# Patient Record
Sex: Male | Born: 1970 | Hispanic: No | Marital: Married | Smoking: Never smoker
Health system: Southern US, Community
[De-identification: ages and names within clinical notes are randomized; demographics above are authoritative.]

## PROBLEM LIST (undated history)

## (undated) DIAGNOSIS — N200 Calculus of kidney: Secondary | ICD-10-CM

## (undated) DIAGNOSIS — N189 Chronic kidney disease, unspecified: Secondary | ICD-10-CM

## (undated) HISTORY — PX: NO PAST SURGERIES: SHX2092

## (undated) HISTORY — DX: Calculus of kidney: N20.0

---

## 2013-01-24 ENCOUNTER — Emergency Department (HOSPITAL_COMMUNITY)
Admission: EM | Admit: 2013-01-24 | Discharge: 2013-01-24 | Disposition: A | Discharge status: Home / Self Care | Payer: 59 | Attending: Emergency Medicine | Admitting: Emergency Medicine

## 2013-01-24 ENCOUNTER — Encounter (HOSPITAL_COMMUNITY): Payer: Self-pay | Admitting: *Deleted

## 2013-01-24 ENCOUNTER — Emergency Department (HOSPITAL_COMMUNITY): Payer: 59

## 2013-01-24 DIAGNOSIS — N23 Unspecified renal colic: Secondary | ICD-10-CM | POA: Insufficient documentation

## 2013-01-24 DIAGNOSIS — R112 Nausea with vomiting, unspecified: Secondary | ICD-10-CM | POA: Insufficient documentation

## 2013-01-24 DIAGNOSIS — R61 Generalized hyperhidrosis: Secondary | ICD-10-CM | POA: Insufficient documentation

## 2013-01-24 LAB — COMPREHENSIVE METABOLIC PANEL
ALT: 25 U/L (ref 0–53)
AST: 35 U/L (ref 0–37)
Albumin: 4.3 g/dL (ref 3.5–5.2)
Alkaline Phosphatase: 79 U/L (ref 39–117)
BUN: 15 mg/dL (ref 6–23)
CO2: 25 mEq/L (ref 19–32)
Calcium: 9.2 mg/dL (ref 8.4–10.5)
Chloride: 102 mEq/L (ref 96–112)
Creatinine, Ser: 0.85 mg/dL (ref 0.50–1.35)
GFR calc Af Amer: >90 mL/min (ref >90)
GFR calc non Af Amer: >90 mL/min (ref >90)
Glucose, Bld: 134 mg/dL — ABNORMAL HIGH (ref 70–99)
Potassium: 4.4 mEq/L (ref 3.5–5.1)
Sodium: 139 mEq/L (ref 135–145)
Total Bilirubin: 0.3 mg/dL (ref 0.3–1.2)
Total Protein: 7.8 g/dL (ref 6.0–8.3)

## 2013-01-24 LAB — URINALYSIS, MICROSCOPIC ONLY
Bilirubin Urine: NEGATIVE
Glucose, UA: NEGATIVE mg/dL
Ketones, ur: NEGATIVE mg/dL
Leukocytes, UA: NEGATIVE
Nitrite: NEGATIVE
Protein, ur: NEGATIVE mg/dL
Specific Gravity, Urine: 1.022 (ref 1.005–1.030)
Urobilinogen, UA: 0.2 mg/dL (ref 0.0–1.0)
pH: 7.5 (ref 5.0–8.0)

## 2013-01-24 LAB — CBC WITH DIFFERENTIAL/PLATELET
Basophils Absolute: 0 10*3/uL (ref 0.0–0.1)
Basophils Relative: 0 % (ref 0–1)
Eosinophils Absolute: 0.1 10*3/uL (ref 0.0–0.7)
Eosinophils Relative: 1 % (ref 0–5)
HCT: 42.9 % (ref 39.0–52.0)
Hemoglobin: 14.9 g/dL (ref 13.0–17.0)
Lymphocytes Relative: 15 % (ref 12–46)
Lymphs Abs: 1.5 10*3/uL (ref 0.7–4.0)
MCH: 28.4 pg (ref 26.0–34.0)
MCHC: 34.7 g/dL (ref 30.0–36.0)
MCV: 81.7 fL (ref 78.0–100.0)
Monocytes Absolute: 0.3 10*3/uL (ref 0.1–1.0)
Monocytes Relative: 3 % (ref 3–12)
Neutro Abs: 8.2 10*3/uL — ABNORMAL HIGH (ref 1.7–7.7)
Neutrophils Relative %: 81 % — ABNORMAL HIGH (ref 43–77)
Platelets: 145 10*3/uL — ABNORMAL LOW (ref 150–400)
RBC: 5.25 MIL/uL (ref 4.22–5.81)
RDW: 12.7 % (ref 11.5–15.5)
WBC: 10.1 10*3/uL (ref 4.0–10.5)

## 2013-01-24 LAB — POCT I-STAT TROPONIN I: Troponin i, poc: 0 ng/mL (ref 0.00–0.08)

## 2013-01-24 MED ORDER — IOHEXOL 300 MG/ML  SOLN
100.0000 mL | Freq: Once | INTRAMUSCULAR | Status: AC | PRN
  Administered 2013-01-24: 100 mL via INTRAVENOUS

## 2013-01-24 MED ORDER — ONDANSETRON HCL 4 MG/2ML IJ SOLN
4.0000 mg | Freq: Once | INTRAMUSCULAR | Status: AC
  Administered 2013-01-24: 4 mg via INTRAVENOUS
  Filled 2013-01-24: qty 2

## 2013-01-24 MED ORDER — OXYCODONE-ACETAMINOPHEN 5-325 MG PO TABS: 1.0000 | ORAL_TABLET | ORAL | Status: DC | PRN

## 2013-01-24 MED ORDER — MORPHINE SULFATE 4 MG/ML IJ SOLN
4.0000 mg | Freq: Once | INTRAMUSCULAR | Status: AC
  Administered 2013-01-24: 4 mg via INTRAVENOUS
  Filled 2013-01-24: qty 1

## 2013-01-24 MED ORDER — FENTANYL CITRATE 0.05 MG/ML IJ SOLN
50.0000 ug | Freq: Once | INTRAMUSCULAR | Status: AC
  Administered 2013-01-24: 06:00:00 via INTRAVENOUS
  Filled 2013-01-24: qty 2

## 2013-01-24 MED ORDER — IOHEXOL 300 MG/ML  SOLN
50.0000 mL | Freq: Once | INTRAMUSCULAR | Status: AC | PRN
  Administered 2013-01-24: 50 mL via ORAL

## 2013-01-24 MED ORDER — PROMETHAZINE HCL 25 MG PO TABS: 25.0000 mg | ORAL_TABLET | Freq: Four times a day (QID) | ORAL | Status: DC | PRN

## 2013-01-24 NOTE — ED Provider Notes (Signed)
History     CSN: [id number]  Arrival date & time 01/24/13  0531   First MD Initiated Contact with Patient 01/24/13 [phone]      Chief Complaint  Patient presents with  . Abdominal Pain  . Emesis    (Consider location/radiation/quality/duration/timing/severity/associated sxs/prior treatment) HPI Comments: Patient presents to the emergency department with complaints of abdominal pain. Patient reports that the pain began around 2:30 AM. He reports severe, sharp, stabbing pains in the right lower abdomen. Patient reports pain was accompanied by nausea, vomiting and sweating. He has not had a fever. Patient reports that he had similar pain approximately 10 years ago and was diagnosed with a kidney stone.  Patient is a 42 y.o. male presenting with abdominal pain and vomiting.  Abdominal Pain Associated symptoms: nausea and vomiting   Associated symptoms: no constipation, no diarrhea and no fever   Emesis Associated symptoms: abdominal pain   Associated symptoms: no diarrhea     No past medical history on file.  No past surgical history on file.  History reviewed. No pertinent family history.  History  Substance Use Topics  . Smoking status: Never Smoker   . Smokeless tobacco: Not on file  . Alcohol Use: No      Review of Systems  Constitutional: Positive for diaphoresis. Negative for fever.  Gastrointestinal: Positive for nausea, vomiting and abdominal pain. Negative for diarrhea, constipation and blood in stool.  All other systems reviewed and are negative.    Allergies  Review of patient's allergies indicates no known allergies.  Home Medications  No current outpatient prescriptions on file.  BP 123/93  Pulse 56  Temp(Src) 97.8 F (36.6 C) (Oral)  Resp 16  SpO2 100%  Physical Exam  Constitutional: He is oriented to person, place, and time. He appears well-developed and well-nourished. No distress.  HENT:  Head: Normocephalic and atraumatic.  Right Ear:  Hearing normal.  Nose: Nose normal.  Mouth/Throat: Oropharynx is clear and moist and mucous membranes are normal.  Eyes: Conjunctivae and EOM are normal. Pupils are equal, round, and reactive to light.  Neck: Normal range of motion. Neck supple.  Cardiovascular: Normal rate, regular rhythm, S1 normal and S2 normal.  Exam reveals no gallop and no friction rub.   No murmur heard. Pulmonary/Chest: Effort normal and breath sounds normal. No respiratory distress. He exhibits no tenderness.  Abdominal: Soft. Normal appearance and bowel sounds are normal. There is no hepatosplenomegaly. There is tenderness in the right lower quadrant. There is no rebound, no guarding, no tenderness at McBurney's point and negative Murphy's sign. No hernia.  Musculoskeletal: Normal range of motion.  Neurological: He is alert and oriented to person, place, and time. He has normal strength. No cranial nerve deficit or sensory deficit. Coordination normal. GCS eye subscore is 4. GCS verbal subscore is 5. GCS motor subscore is 6.  Skin: Skin is warm, dry and intact. No rash noted. No cyanosis.  Psychiatric: He has a normal mood and affect. His speech is normal and behavior is normal. Thought content normal.    ED Course  Procedures (including critical care time)  Labs Reviewed  CBC WITH DIFFERENTIAL - Abnormal; Notable for the following:    Platelets 145 (*)    Neutrophils Relative 81 (*)    Neutro Abs 8.2 (*)    All other components within normal limits  COMPREHENSIVE METABOLIC PANEL - Abnormal; Notable for the following:    Glucose, Bld 134 (*)    All other components within  normal limits  URINALYSIS, MICROSCOPIC ONLY - Abnormal; Notable for the following:    Hgb urine dipstick LARGE (*)    Bacteria, UA FEW (*)    All other components within normal limits  POCT I-STAT TROPONIN I   Ct Abdomen Pelvis W Contrast  01/24/2013  *RADIOLOGY REPORT*  Clinical Data: Right lower quadrant pain and emesis  CT ABDOMEN AND  PELVIS WITH CONTRAST  Technique:  Multidetector CT imaging of the abdomen and pelvis was performed following the standard protocol during bolus administration of intravenous contrast.  Contrast: 50mL OMNIPAQUE IOHEXOL 300 MG/ML  SOLN, OMNIPAQUE IOHEXOL 300 MG/ML  SOLN  Comparison: None.  Findings: The lung bases appear clear.  No pericardial or pleural effusion.  There are no suspicious liver abnormalities identified. Gallbladder is normal.  There is no biliary dilatation.  The pancreas is unremarkable.  Normal appearance of the spleen.  The adrenal glands are both unremarkable.  Asymmetric right-sided hydronephrosis and hydroureter is noted.  Within the mid right ureter there is a stone measuring 5 mm, image 44/series 2.  The left kidney appears normal.  The urinary bladder appears normal. Prostate gland and seminal vesicles are unremarkable.  Normal caliber of the abdominal aorta.  There is no enlarged upper abdominal or pelvic lymph nodes.  The stomach is normal.  The small bowel loops have a normal course and caliber.  The appendix is visualized and appears within normal limits.  Normal appearance of the proximal colon.  Distal colon is unremarkable.  Review of the visualized osseous structures is unremarkable.  IMPRESSION:  1.  Stone within the right mid ureter is identified measuring 5 mm   Original Report Authenticated By: Signa Kell, M.D.      Diagnoses: Right renal colic    MDM  Patient comes to the ER for evaluation of right-sided abdominal and flank pain. Symptoms began early this morning. He has had nausea and vomiting associated with the symptoms. Renal colic secondary to ureterolithiasis with suspected, the patient did have some tenderness in the right lower quadrant and therefore CAT scan was ordered to rule out appendicitis. CAT scan did confirm 5 mm stone in the right mid ureter explaining the patient's symptoms. Patient is feeling better after IV analgesia here in the ER. He'll be  discharged with analgesia, followup with urology. Return to the ER symptoms are uncontrolled.        Gilda Crease, MD 01/24/13 1002

## 2013-02-03 ENCOUNTER — Emergency Department (HOSPITAL_COMMUNITY)
Admission: EM | Admit: 2013-02-03 | Discharge: 2013-02-03 | Disposition: A | Discharge status: Home Health | Payer: 59 | Attending: Emergency Medicine | Admitting: Emergency Medicine

## 2013-02-03 ENCOUNTER — Emergency Department (HOSPITAL_COMMUNITY): Payer: 59

## 2013-02-03 ENCOUNTER — Encounter (HOSPITAL_COMMUNITY): Payer: Self-pay | Admitting: *Deleted

## 2013-02-03 DIAGNOSIS — R109 Unspecified abdominal pain: Secondary | ICD-10-CM

## 2013-02-03 DIAGNOSIS — Z87442 Personal history of urinary calculi: Secondary | ICD-10-CM | POA: Insufficient documentation

## 2013-02-03 LAB — URINALYSIS, ROUTINE W REFLEX MICROSCOPIC
Bilirubin Urine: NEGATIVE
Glucose, UA: NEGATIVE mg/dL
Ketones, ur: NEGATIVE mg/dL
Leukocytes, UA: NEGATIVE
Nitrite: NEGATIVE
Protein, ur: NEGATIVE mg/dL
Specific Gravity, Urine: 1.017 (ref 1.005–1.030)
Urobilinogen, UA: 0.2 mg/dL (ref 0.0–1.0)
pH: 8.5 — ABNORMAL HIGH (ref 5.0–8.0)

## 2013-02-03 LAB — URINE MICROSCOPIC-ADD ON

## 2013-02-03 MED ORDER — HYDROMORPHONE HCL PF 1 MG/ML IJ SOLN
1.0000 mg | Freq: Once | INTRAMUSCULAR | Status: AC
  Administered 2013-02-03: 1 mg via INTRAVENOUS
  Filled 2013-02-03: qty 1

## 2013-02-03 MED ORDER — ONDANSETRON HCL 4 MG/2ML IJ SOLN
4.0000 mg | Freq: Once | INTRAMUSCULAR | Status: AC
  Administered 2013-02-03: 4 mg via INTRAVENOUS
  Filled 2013-02-03: qty 2

## 2013-02-03 MED ORDER — TAMSULOSIN HCL 0.4 MG PO CAPS: 0.4000 mg | ORAL_CAPSULE | Freq: Every day | ORAL | Status: DC

## 2013-02-03 MED ORDER — KETOROLAC TROMETHAMINE 30 MG/ML IJ SOLN
30.0000 mg | Freq: Once | INTRAMUSCULAR | Status: AC
  Administered 2013-02-03: 30 mg via INTRAVENOUS
  Filled 2013-02-03: qty 1

## 2013-02-03 MED ORDER — OXYCODONE-ACETAMINOPHEN 5-325 MG PO TABS: 2.0000 | ORAL_TABLET | ORAL | Status: DC | PRN

## 2013-02-03 NOTE — ED Notes (Signed)
MD at bedside. 

## 2013-02-03 NOTE — ED Notes (Signed)
Pt reports right sided flank pain, was seen in ED last week, dx with kidney stone, has not passed yet. Reports right sided flank pain progressively gotten worse, rates pain 10/10. Took one oxycodone today without relief of symptoms

## 2013-02-03 NOTE — ED Provider Notes (Signed)
History     CSN: [id number]  Arrival date & time 02/03/13  1518   First MD Initiated Contact with Patient 02/03/13 1601      Chief Complaint  Patient presents with  . Flank Pain    (Consider location/radiation/quality/duration/timing/severity/associated sxs/prior treatment) HPI.... right flank pain with radiation to right lower quadrant earlier today. Patient was seen on 01/24/2013 for a right-sided kidney stone. This was confirmed by CT scan. Stone 5 mm in diameter.  Patient has good appetite. Pain is moderate in intensity and sharp.  No fever, chills, dysuria, hematuria  History reviewed. No pertinent past medical history.  History reviewed. No pertinent past surgical history.  No family history on file.  History  Substance Use Topics  . Smoking status: Never Smoker   . Smokeless tobacco: Not on file  . Alcohol Use: No      Review of Systems  All other systems reviewed and are negative.    Allergies  Review of patient's allergies indicates no known allergies.  Home Medications   Current Outpatient Rx  Name  Route  Sig  Dispense  Refill  . oxyCODONE-acetaminophen (PERCOCET) 5-325 MG per tablet   Oral   Take 1-2 tablets by mouth every 4 (four) hours as needed for pain.   20 tablet   0   . oxyCODONE-acetaminophen (PERCOCET) 5-325 MG per tablet   Oral   Take 2 tablets by mouth every 4 (four) hours as needed for pain.   25 tablet   0   . tamsulosin (FLOMAX) 0.4 MG CAPS   Oral   Take 1 capsule (0.4 mg total) by mouth daily.   15 capsule   0     BP 118/68  Pulse 62  Temp(Src) 97.7 F (36.5 C) (Oral)  Resp 17  Ht 5\' 8"  (1.727 m)  Wt 150 lb (68.04 kg)  BMI 22.81 kg/m2  SpO2 98%  Physical Exam  Nursing note and vitals reviewed. Constitutional: He is oriented to person, place, and time. He appears well-developed and well-nourished.  HENT:  Head: Normocephalic and atraumatic.  Eyes: Conjunctivae and EOM are normal. Pupils are equal, round, and  reactive to light.  Neck: Normal range of motion. Neck supple.  Cardiovascular: Normal rate, regular rhythm and normal heart sounds.   Pulmonary/Chest: Effort normal and breath sounds normal.  Abdominal: Soft. Bowel sounds are normal.  Genitourinary:  Minimal tenderness right flank with minimal tenderness and right lower quadrant  Musculoskeletal: Normal range of motion.  Neurological: He is alert and oriented to person, place, and time.  Skin: Skin is warm and dry.  Psychiatric: He has a normal mood and affect.    ED Course  Procedures (including critical care time)  Labs Reviewed  URINALYSIS, ROUTINE W REFLEX MICROSCOPIC - Abnormal; Notable for the following:    APPearance TURBID (*)    pH 8.5 (*)    Hgb urine dipstick MODERATE (*)    All other components within normal limits  URINE MICROSCOPIC-ADD ON - Abnormal; Notable for the following:    Casts GRANULAR CAST (*)    All other components within normal limits   Dg Abd 1 View  02/03/2013  *RADIOLOGY REPORT*  Clinical Data: 42 year old male severe right sided lower abdominal pain.  History of right ureteral stone.  ABDOMEN - 1 VIEW  Comparison: CT abdomen and pelvis 01/24/2013.  Findings: Mid right ureteral calculus on the comparison was not visible on the scout view.  There is no other right nephrolithiasis at that  time.  Nonetheless, there is a subtle 5 mm calculus projecting partially over the L4 right transverse process.  Stable left hemi pelvis phlebolith.  No other abnormal calcification identified in the abdomen or pelvis.  Nonobstructed bowel gas pattern. No acute osseous abnormality identified.  IMPRESSION: 1.  Cannot exclude persistent right ureteral calculus at the L4 transverse process level. 2.  No other findings suspicious for urologic calculus. Nonobstructed bowel gas pattern.   Original Report Authenticated By: Erskine Speed, M.D.      1. Right flank pain       MDM  I suspect this is persistent kidney stone pain.  Patient feels better after pain management. We discussed the possibility of appendicitis, but patient still has a good appetite which typically goes against appendicitis. Discussed treatment options with patient and his wife. They will call the urologist on call if symptoms persist. He understands to return here for any worsening concerns. Rx Percocet #25 and Flomax #15        Donnetta Hutching, MD 02/03/13 2213

## 2013-03-16 ENCOUNTER — Other Ambulatory Visit: Payer: Self-pay | Admitting: Urology

## 2013-03-27 ENCOUNTER — Encounter (HOSPITAL_COMMUNITY): Payer: Self-pay | Admitting: Pharmacy Technician

## 2013-03-29 ENCOUNTER — Encounter (HOSPITAL_COMMUNITY): Payer: Self-pay | Admitting: *Deleted

## 2013-04-02 NOTE — H&P (Signed)
History of Present Illness  Calculus disease: A CT scan  On 01/24/13 revealed a 5 mm stone located at the level of the L3 vertebral body with Hounsfield units of ~500. No renal stones were identified. His urinalysis had a pH of 7.5 and 8.5 on 2 separate occasions. His electrolytes revealed a normal serum potassium, calcium and CO2. As he passed a stone about 10 years ago.  Interval history: He elected to proceed with medical expulsive therapy. He told me that in fact he has not been taking it regularly. He said he does not like to take medication. He has not been having any flank pain, hematuria or new voiding symptoms..   Past Medical History Problems  1. History of  No Medical Problems  Surgical History Problems  1. History of  No Surgical Problems  Current Meds 1. Oxycodone-Acetaminophen 5-325 MG Oral Tablet; Therapy: 22Mar2014 to 2. Tamsulosin HCl 0.4 MG Oral Capsule; Therapy: 22Mar2014 to  Allergies Medication  1. No Known Drug Allergies  Family History Problems  1. Family history of  No Significant Family History  Social History Problems  1. Caffeine Use 2. Marital History - Currently Married 3. Never A Smoker Denied  4. History of  Alcohol Use  Review of Systems Genitourinary, constitutional, skin, eye, otolaryngeal, hematologic/lymphatic, cardiovascular, pulmonary, endocrine, musculoskeletal, gastrointestinal, neurological and psychiatric system(s) were reviewed and pertinent findings if present are noted.  Gastrointestinal: nausea and vomiting.  Musculoskeletal: back pain.    Vitals Vital Signs BMI Calculated: 24.2 BSA Calculated: 1.83 Height: 5 ft 7.5 in Weight: 156 lb  Blood Pressure: 99 / 69 Temperature: 95.9 F Heart Rate: 57  Physical Exam Constitutional: Well nourished and well developed . No acute distress.  ENT:. The ears and nose are normal in appearance.  Neck: The appearance of the neck is normal and no neck mass is present.  Pulmonary: No  respiratory distress and normal respiratory rhythm and effort.  Cardiovascular: Heart rate and rhythm are normal . No peripheral edema.  Abdomen: The abdomen is soft and nontender. No masses are palpated. No CVA tenderness. No hernias are palpable. No hepatosplenomegaly noted.  Lymphatics: The femoral and inguinal nodes are not enlarged or tender.  Skin: Normal skin turgor, no visible rash and no visible skin lesions.  Neuro/Psych:. Mood and affect are appropriate.    Assessment  We discussed the fact that there has been no progression of the stone despite medical expulsive therapy. He has not been completely compliant with the medication. We discussed continued medical expulsive therapy but also I went over the treatment options with him today. The 2 options most suited for the stone based on its size and location would be ureteroscopy and lithotripsy. I discussed each of these in detail as well as the pluses and minuses of each of these forms of surgery. He elected to proceed with lithotripsy if he does not pass his stone over the next couple of weeks. He said he would like to reinitiate medical expulsive therapy. I told him that if he did not pass his stone we would proceed with lithotripsy and I therefore discussed it in complete detail with a discussion of the potential risks and complications, the probability of success, the outpatient nature and the anticipated postoperative course. Have answered all of his questions as well as his wife's. He understands and has elected to proceed if he does not pass the stone spontaneously.   Plan    1. I have refilled his prescription for tamsulosin today.  2. He'll be scheduled for lithotripsy contact me if he passes his stone.

## 2013-04-03 ENCOUNTER — Encounter (HOSPITAL_COMMUNITY)
Admission: RE | Disposition: A | Discharge status: Home / Self Care | Payer: Self-pay | Source: Ambulatory Visit | Attending: Urology

## 2013-04-03 ENCOUNTER — Ambulatory Visit (HOSPITAL_COMMUNITY): Payer: 59

## 2013-04-03 ENCOUNTER — Encounter (HOSPITAL_COMMUNITY): Payer: Self-pay | Admitting: General Practice

## 2013-04-03 ENCOUNTER — Ambulatory Visit (HOSPITAL_COMMUNITY)
Admission: RE | Admit: 2013-04-03 | Discharge: 2013-04-03 | Disposition: A | Discharge status: Home / Self Care | Payer: 59 | Source: Ambulatory Visit | Attending: Urology | Admitting: Urology

## 2013-04-03 DIAGNOSIS — N201 Calculus of ureter: Secondary | ICD-10-CM | POA: Insufficient documentation

## 2013-04-03 HISTORY — DX: Chronic kidney disease, unspecified: N18.9

## 2013-04-03 SURGERY — LITHOTRIPSY, ESWL
Anesthesia: LOCAL | Laterality: Right

## 2013-04-03 MED ORDER — DIAZEPAM 5 MG PO TABS
10.0000 mg | ORAL_TABLET | ORAL | Status: AC
  Administered 2013-04-03: 10 mg via ORAL
  Filled 2013-04-03: qty 2

## 2013-04-03 MED ORDER — OXYBUTYNIN CHLORIDE 5 MG PO TABS
5.0000 mg | ORAL_TABLET | Freq: Once | ORAL | Status: AC
  Administered 2013-04-03: 5 mg via ORAL
  Filled 2013-04-03: qty 1

## 2013-04-03 MED ORDER — HYDROCODONE-ACETAMINOPHEN 10-325 MG PO TABS
1.0000 | ORAL_TABLET | Freq: Once | ORAL | Status: AC
  Administered 2013-04-03: 1 via ORAL
  Filled 2013-04-03: qty 1

## 2013-04-03 MED ORDER — KETOROLAC TROMETHAMINE 30 MG/ML IJ SOLN: 30.0000 mg | Freq: Once | INTRAMUSCULAR | Status: DC

## 2013-04-03 MED ORDER — CIPROFLOXACIN HCL 500 MG PO TABS
500.0000 mg | ORAL_TABLET | ORAL | Status: AC
  Administered 2013-04-03: 500 mg via ORAL
  Filled 2013-04-03: qty 1

## 2013-04-03 MED ORDER — DIPHENHYDRAMINE HCL 25 MG PO CAPS
25.0000 mg | ORAL_CAPSULE | ORAL | Status: AC
  Administered 2013-04-03: 25 mg via ORAL
  Filled 2013-04-03: qty 1

## 2013-04-03 MED ORDER — KETOROLAC TROMETHAMINE 30 MG/ML IJ SOLN
INTRAMUSCULAR | Status: AC
  Administered 2013-04-03: 30 mg via INTRAVENOUS
  Filled 2013-04-03: qty 1

## 2013-04-03 MED ORDER — PHENAZOPYRIDINE HCL 200 MG PO TABS
200.0000 mg | ORAL_TABLET | Freq: Once | ORAL | Status: AC
  Administered 2013-04-03: 200 mg via ORAL
  Filled 2013-04-03: qty 1

## 2013-04-03 MED ORDER — HYDROCODONE-ACETAMINOPHEN 10-325 MG PO TABS: 1.0000 | ORAL_TABLET | Freq: Four times a day (QID) | ORAL | Status: AC | PRN

## 2013-04-03 MED ORDER — SODIUM CHLORIDE 0.9 % IV SOLN
INTRAVENOUS | Status: DC
  Administered 2013-04-03: 09:00:00 via INTRAVENOUS

## 2013-04-03 MED ORDER — TAMSULOSIN HCL 0.4 MG PO CAPS: 0.4000 mg | ORAL_CAPSULE | ORAL | Status: AC

## 2013-04-03 NOTE — Progress Notes (Signed)
Notified Dr Vernie Ammons of pts pain status. Dr Vernie Ammons will change prescription of hydrocodone to oxycodone.

## 2013-04-03 NOTE — Progress Notes (Signed)
Notified DR Vernie Ammons re: pts pain & to verify prescriptions per wife's request. Received orders for toradol IV.

## 2013-04-03 NOTE — Interval H&P Note (Signed)
History and Physical Interval Note:  04/03/2013 10:19 AM  Tony Dawson  has presented today for surgery, with the diagnosis of Right Ureteral Stone  The various methods of treatment have been discussed with the patient and family. After consideration of risks, benefits and other options for treatment, the patient has consented to  Procedure(s): RIGHT EXTRACORPOREAL SHOCK WAVE LITHOTRIPSY (ESWL) (Right) as a surgical intervention .  The patient's history has been reviewed, patient examined, no change in status, stable for surgery.  I have reviewed the patient's chart and labs.  Questions were answered to the patient's satisfaction.     Garnett Farm

## 2013-04-03 NOTE — Op Note (Signed)
See [facility] OP note scanned into chart. 

## 2021-01-06 ENCOUNTER — Other Ambulatory Visit: Payer: Self-pay | Admitting: Family Medicine

## 2021-01-06 DIAGNOSIS — R7401 Elevation of levels of liver transaminase levels: Secondary | ICD-10-CM

## 2021-01-20 ENCOUNTER — Other Ambulatory Visit: Payer: Self-pay

## 2021-01-24 ENCOUNTER — Ambulatory Visit
Admission: RE | Admit: 2021-01-24 | Discharge: 2021-01-24 | Disposition: A | Discharge status: Home / Self Care | Payer: Self-pay | Source: Ambulatory Visit | Attending: Family Medicine | Admitting: Family Medicine

## 2021-01-24 DIAGNOSIS — R7401 Elevation of levels of liver transaminase levels: Secondary | ICD-10-CM

## 2021-02-13 ENCOUNTER — Encounter: Payer: Self-pay | Admitting: Dietician

## 2021-02-13 ENCOUNTER — Other Ambulatory Visit: Payer: Self-pay

## 2021-02-13 ENCOUNTER — Encounter: Payer: No Typology Code available for payment source | Attending: Family Medicine | Admitting: Dietician

## 2021-02-13 VITALS — Ht 68.0 in | Wt 173.5 lb

## 2021-02-13 DIAGNOSIS — K760 Fatty (change of) liver, not elsewhere classified: Secondary | ICD-10-CM | POA: Diagnosis not present

## 2021-02-13 NOTE — Progress Notes (Signed)
Medical Nutrition Therapy  Appointment Start time:  1540  Appointment End time:  1650  Primary concerns today: Fatty Liver, Prediabetes  Referral diagnosis: K76.0 Fatty Liver Preferred learning style: No preference indicated Learning readiness: Change in progress   NUTRITION ASSESSMENT   Anthropometrics  Ht: 5'8" Wt: 173.6 lbs Body mass index is 26.38 kg/m.  Clinical Medical Hx: HLD, Prediabetes Medications: [place] Labs:  AST - 46 (high) ALT - 67 (high) A1c - 5.7%, Vit D - 8.7 (Very Low) TC - 218 (high), LDL - 128 (high), TGL - 247 (high) Notable Signs/Symptoms: Fatigue  Lifestyle & Dietary Hx Pt wife Ezzie Dural is present for appointment. Pt works in Consulting civil engineer for [employer], pt is sedentary at work.  Pt reports being vegetarian, consumes dairy and eggs. Pt is interested in weight loss. Pt reports drinking a lot of soda, alcohol during pandemic. Pt was also eating a lot of processed and packaged foods during the pandemic. Pt reports switching to lower fat milk (1%), but still consumes sources of high fat dairy including paneer cottage cheese, and hard cheeses. Pt reports only drinking tea, water, and milk now. Pt reports making a lot of drastic changes in the last few weeks, and finds them difficult. Pt reports restricting their intake at times. Pt wife states pt has a sweet smell when they starve themselves Pt will have bloodwork done in late May to see how they are progressing.  Estimated daily fluid intake: 64 oz Supplements: Vitamin B12, Vitamin D Sleep: ~7 hours a day, wakes up a few times each night Stress / self-care: Low stress, occasionally work related stress. Current average weekly physical activity: 3 mile walk/jog, 3 days a week.  24-Hr Dietary Recall First Meal: Tea with 1% milk, oatmeal with 1 apple, handful of almonds Snack: none Second Meal: 3 Whole wheat tortilla, tea with 1% milk Snack: cucumber, orange, tea 1% milk Third Meal: Lentils, rice, 2  tortilla, water Snack: none Beverages: tea, water, milk   NUTRITION DIAGNOSIS  NB-1.2 Harmful beliefs/attitudes about food or nutrition-related topics (use with caution) As related to fatty liver.  As evidenced by elevated liver enzymes, diet history previously high in processed foods and SSB, and history of sedentary lifestlye.Marland Kitchen   NUTRITION INTERVENTION  Nutrition education (E-1) on the following topics:  . Fatty liver o Educated pt on factors that lead to the development of fatty liver including sedentary lifestyle, over consumption of alcohol, refined sugars, sugar sweetened beverages, and high fat processed foods.  Handouts Provided Include   Soluble and Insoluble Foods List Nutrition Care Manual  NAFLD Guidelines  Balanced Plate  Learning Style & Readiness for Change Teaching method utilized: Visual & Auditory  Demonstrated degree of understanding via: Teach Back  Barriers to learning/adherence to lifestyle change: none  Goals Established by Pt  Choose oil based salad dressings instead of cream based salad dressings  Choose nutrient dense foods more often than energy dense foods  Consider MyFitnessPal for tracking your foods  Work towards 30 minutes of exercise 5 times a week  Have a source of protein at every meal. Try to eat 3 meals each day, no more than 5-6 hours apart.  Have only 4oz of tea in the afternoon, then work to having it every other day until you are able stop having tea after lunch time.  Choose low-fat cottage cheese in place of the paneer cottage cheese.   MONITORING & EVALUATION Dietary intake, weekly physical activity PRN.  Next Steps  Patient  is to call to set up a follow up.

## 2021-02-13 NOTE — Patient Instructions (Signed)
Choose oil based salad dressings instead of cream based salad dressings  Choose nutrient dense foods more often than energy dense foods  Consider MyFitnessPal for tracking your foods  Work towards 30 minutes of exercise 5 times a week  Have a source of protein at every meal. Try to eat 3 meals each day, no more than 5-6 hours apart.  Have only 4oz of tea in the afternoon, then work to having it every other day until you are able stop having tea after lunch time.  Choose low-fat cottage cheese in place of the paneer cottage cheese.

## 2021-06-24 IMAGING — US US ABDOMEN LIMITED RUQ/ASCITES
1 series · 14 of 25 positions shown · non-contrast
Comparison: None.

CLINICAL DATA: Elevated liver enzymes

EXAM:
ULTRASOUND ABDOMEN LIMITED RIGHT UPPER QUADRANT

[Series 1: us abdomen limited ruq/ascites · 0.26mm/px · 14 of 35 slices shown]
[im 1/35]
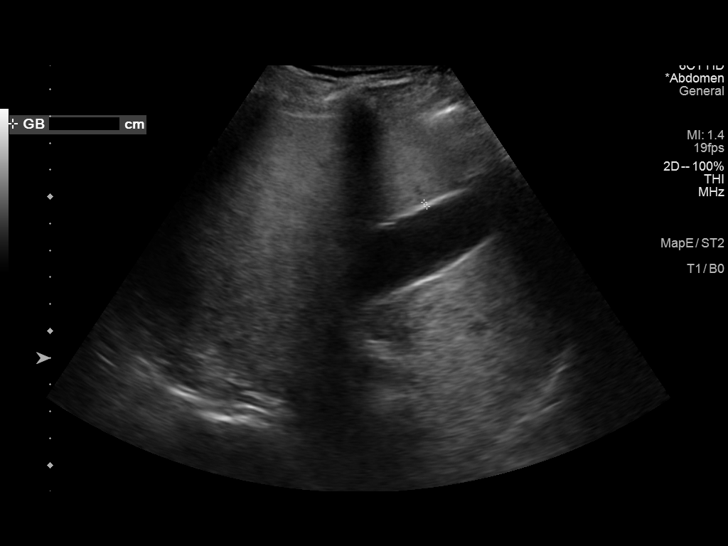
[im 3/35]
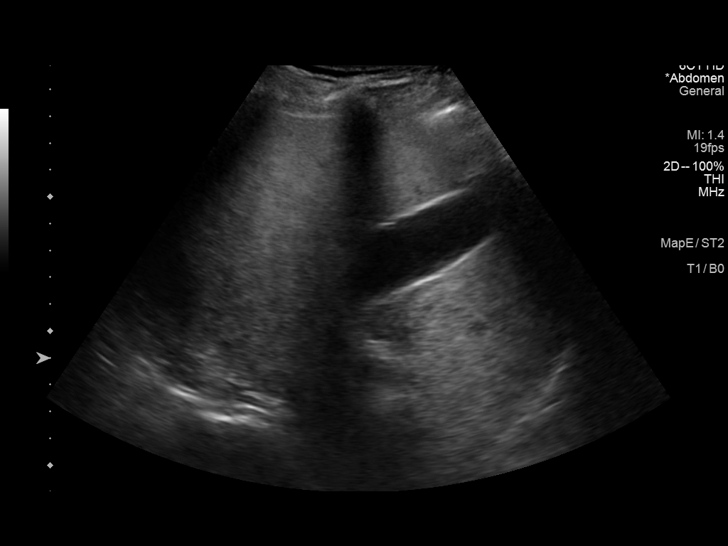
[im 6/35]
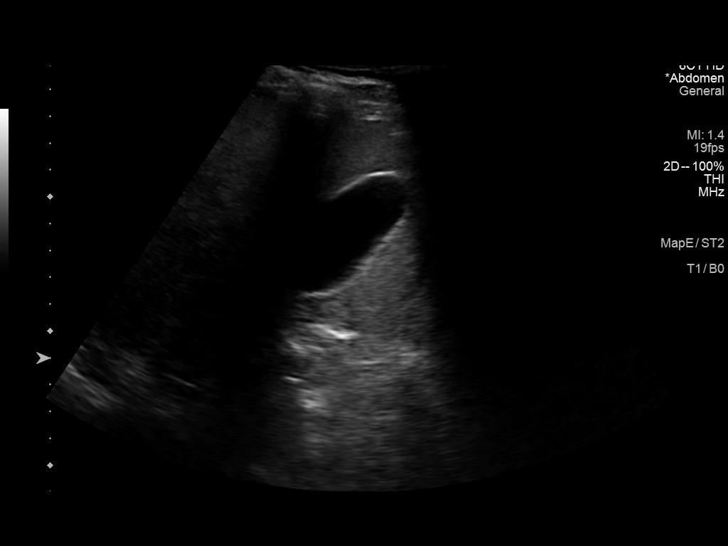
[im 9/35]
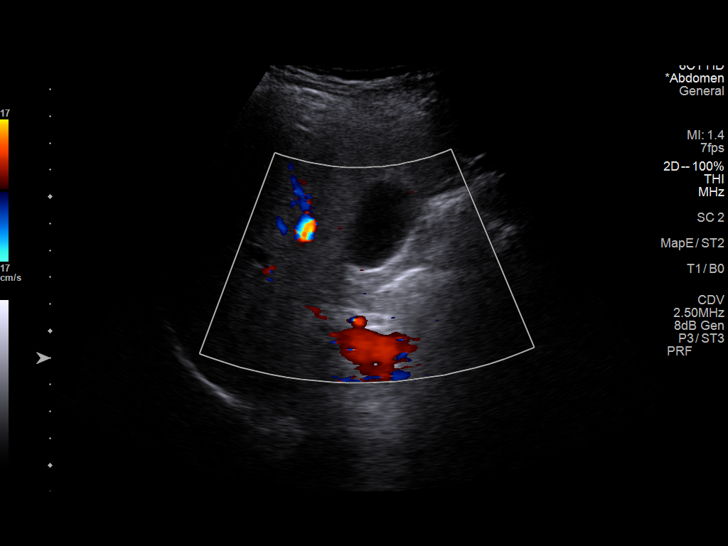
[im 12/35]
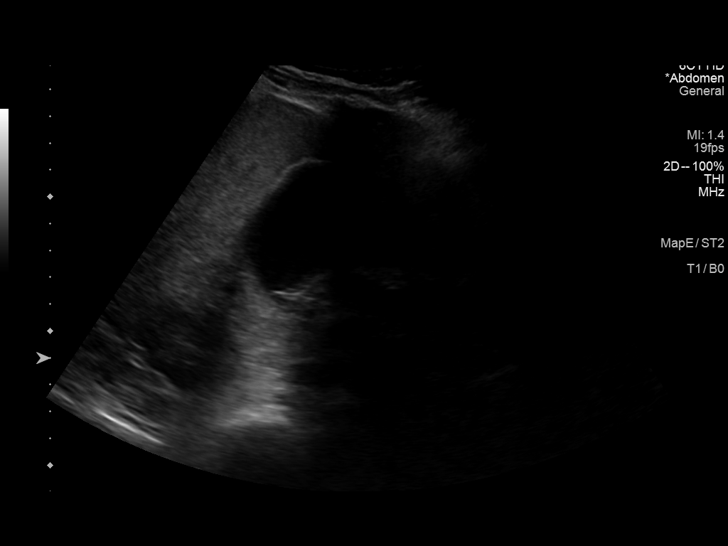
[im 13/35]
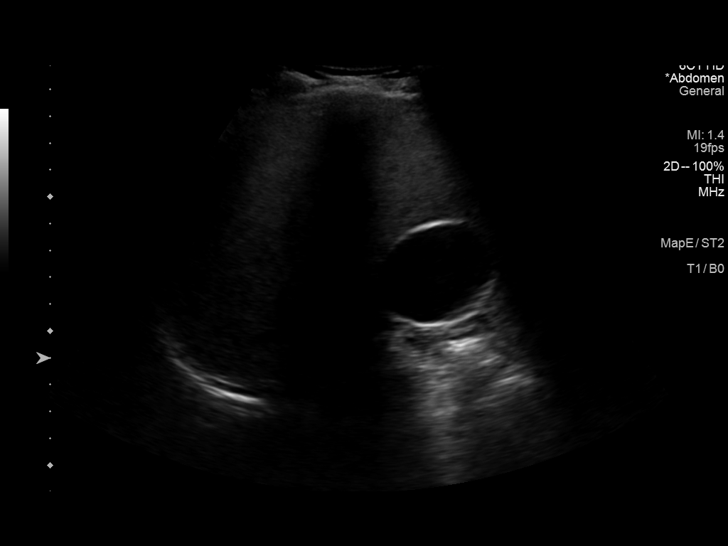
[im 16/35]
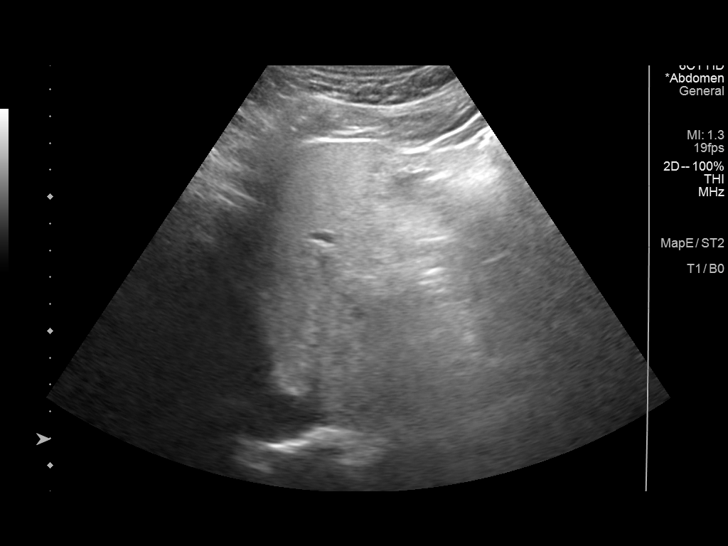
[im 19/35]
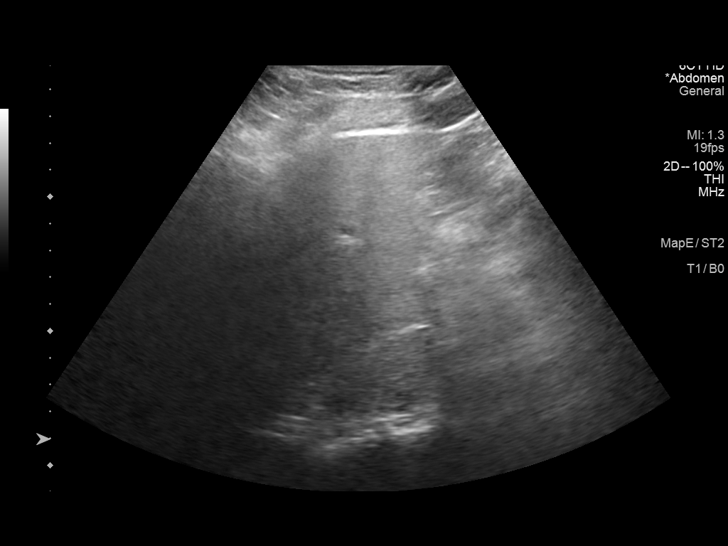
[im 22/35]
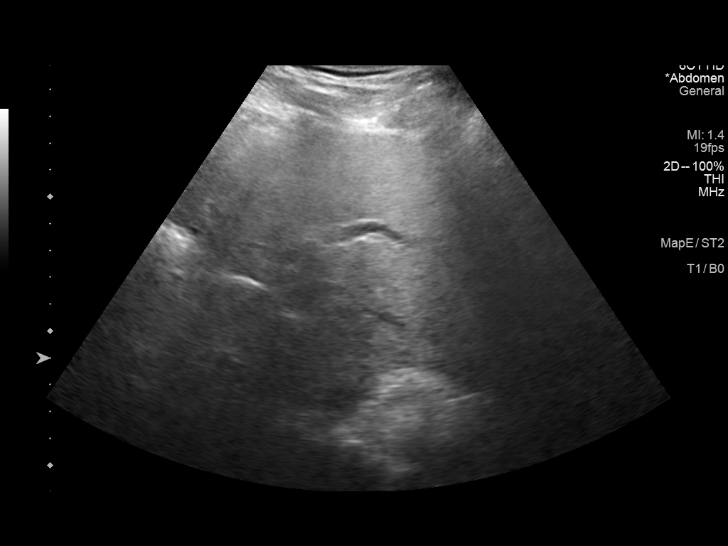
[im 23/35]
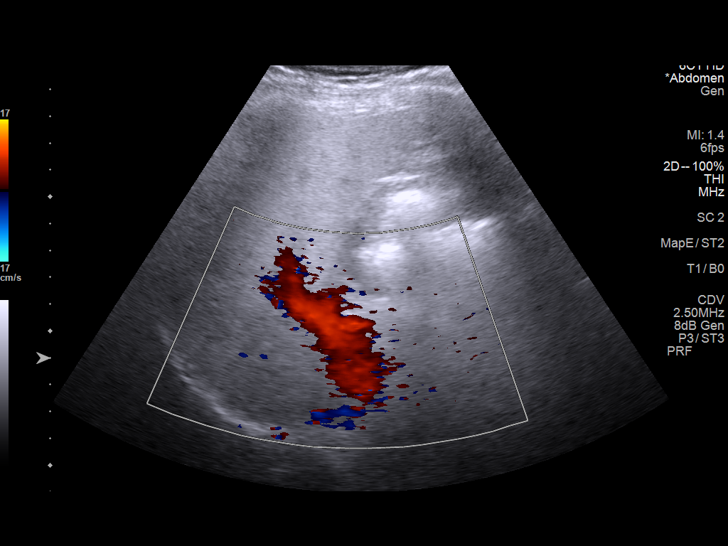
[im 26/35]
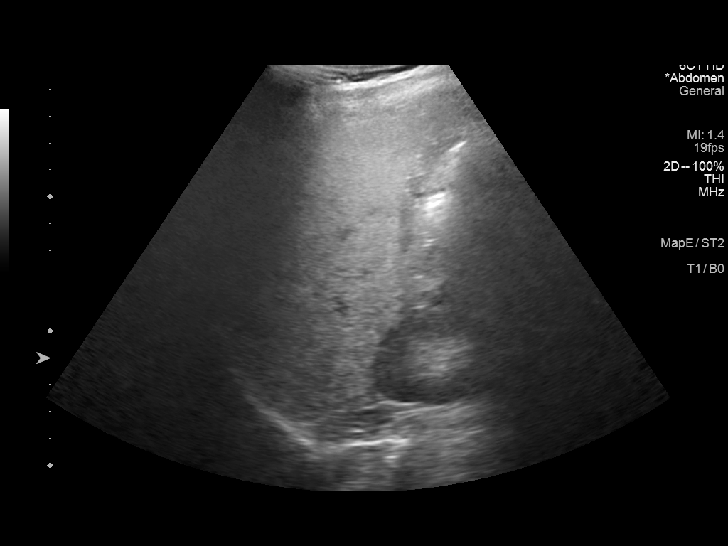
[im 29/35]
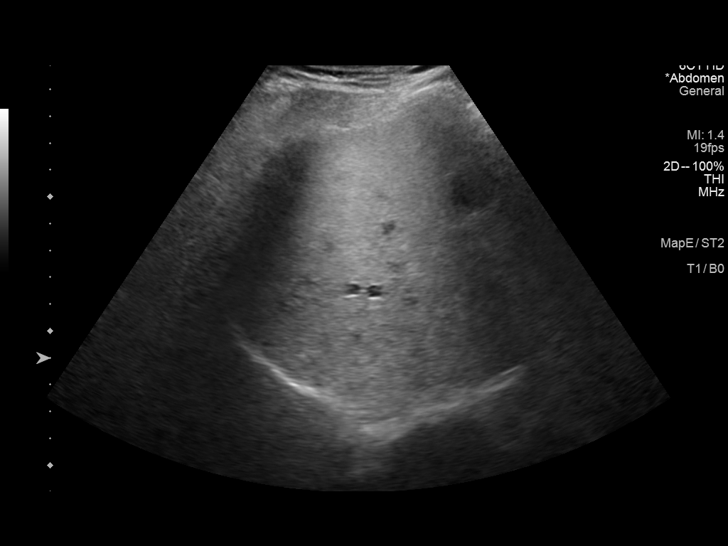
[im 32/35]
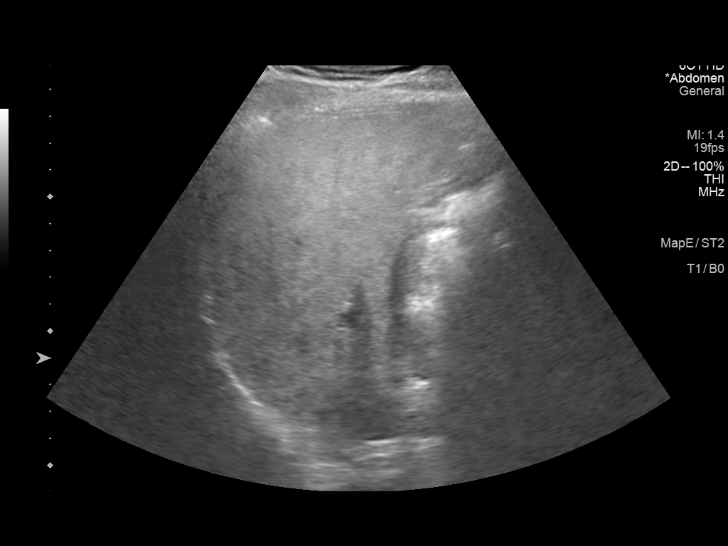
[im 35/35]
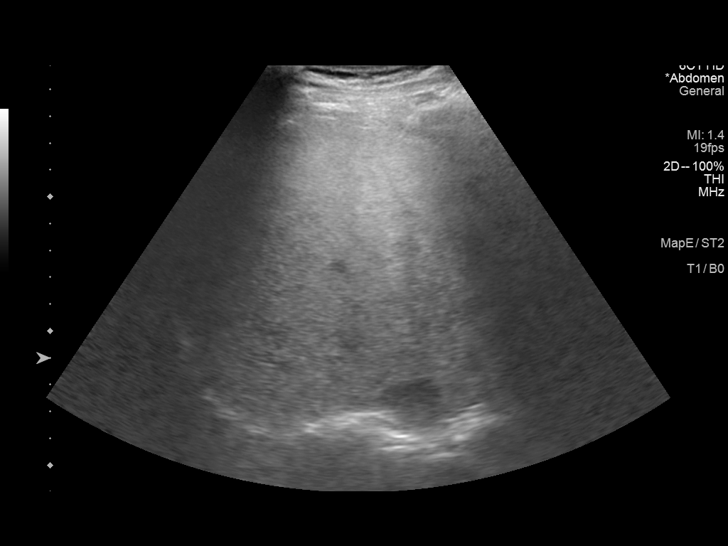

[14 of 25 positions shown; findings below may reference images not displayed]

FINDINGS: Gallbladder:

No gallstones or wall thickening visualized. No sonographic Murphy
sign noted by sonographer.

Common bile duct:

Diameter: 3.2 mm

Liver:

Diffuse increased echogenicity with no focal mass. Portal vein is
patent on color Doppler imaging with normal direction of blood flow
towards the liver.

Other: None.
IMPRESSION: 1. Diffuse increased echogenicity in the liver without focal mass is
nonspecific but often seen with hepatic steatosis.
2. No other abnormalities.

## 2023-08-07 ENCOUNTER — Other Ambulatory Visit: Payer: Self-pay

## 2023-08-07 ENCOUNTER — Emergency Department (HOSPITAL_BASED_OUTPATIENT_CLINIC_OR_DEPARTMENT_OTHER)
Admission: EM | Admit: 2023-08-07 | Discharge: 2023-08-08 | Disposition: A | Discharge status: Home / Self Care | Payer: No Typology Code available for payment source | Attending: Emergency Medicine | Admitting: Emergency Medicine

## 2023-08-07 DIAGNOSIS — W311XXA Contact with metalworking machines, initial encounter: Secondary | ICD-10-CM | POA: Insufficient documentation

## 2023-08-07 DIAGNOSIS — Z23 Encounter for immunization: Secondary | ICD-10-CM | POA: Diagnosis not present

## 2023-08-07 DIAGNOSIS — S61213A Laceration without foreign body of left middle finger without damage to nail, initial encounter: Secondary | ICD-10-CM | POA: Diagnosis not present

## 2023-08-07 DIAGNOSIS — S60943A Unspecified superficial injury of left middle finger, initial encounter: Secondary | ICD-10-CM | POA: Diagnosis present

## 2023-08-07 MED ORDER — TETANUS-DIPHTH-ACELL PERTUSSIS 5-2.5-18.5 LF-MCG/0.5 IM SUSY
0.5000 mL | PREFILLED_SYRINGE | Freq: Once | INTRAMUSCULAR | Status: AC
  Administered 2023-08-08: 0.5 mL via INTRAMUSCULAR
  Filled 2023-08-07: qty 0.5

## 2023-08-07 NOTE — ED Provider Notes (Signed)
  [place] EMERGENCY DEPARTMENT AT [facility] Provider Note   CSN: [id number] Arrival date & time: 08/07/23  2220     History  Chief Complaint  Patient presents with   Laceration    Tony Dawson is a 52 y.o. male.  Patient is a 52 year old male presenting with a left finger injury.  Patient was installing a new microwave when he cut his middle finger on a piece of sharp metal.  Bleeding controlled with direct pressure.  Last tetanus unknown.  The history is provided by the patient.       Home Medications Prior to Admission medications   Medication Sig Start Date End Date Taking? Authorizing Provider  cetirizine (ZYRTEC) 10 MG tablet Take 10 mg by mouth daily. Patient not taking: Reported on 02/13/2021    [provider]  Cholecalciferol (VITAMIN D3) 125 MCG (5000 UT) CAPS See admin instructions.    [provider]  Cyanocobalamin (VITAMIN B12) 1000 MCG TBCR 1 tablet    [provider]  HYDROcodone-acetaminophen (NORCO) 10-325 MG per tablet Take 1 tablet by mouth every 6 (six) hours as needed for pain. Patient not taking: Reported on 02/13/2021 04/03/13   Ihor Gully, MD  tamsulosin [facility]) 0.4 MG CAPS Take 0.4 mg by mouth daily after breakfast. Patient not taking: Reported on 02/13/2021    [provider]  tamsulosin (FLOMAX) 0.4 MG CAPS Take 1 capsule (0.4 mg total) by mouth daily after supper. 04/03/13   Ihor Gully, MD      Allergies    Patient has no known allergies.    Review of Systems   Review of Systems  All other systems reviewed and are negative.   Physical Exam Updated Vital Signs BP (!) 136/96 (BP Location: Right Arm)   Pulse 61   Temp 97.8 F (36.6 C)   Resp 17   Ht 5\' 7"  (1.702 m)   Wt 77.1 kg   SpO2 99%   BMI 26.63 kg/m  Physical Exam Vitals and nursing note reviewed.  Constitutional:      Appearance: Normal appearance.  Pulmonary:     Effort: Pulmonary effort is normal.   Musculoskeletal:     Comments: The dorsal aspect of the left middle finger has an area of superficial skin avulsed just proximal to the nail.  It does not involve the nail itself.  Skin:    General: Skin is warm and dry.  Neurological:     Mental Status: He is oriented to person, place, and time.     ED Results / Procedures / Treatments   Labs (all labs ordered are listed, but only abnormal results are displayed) Labs Reviewed - No data to display  EKG None  Radiology No results found.  Procedures Procedures    Medications Ordered in ED Medications  Tdap (BOOSTRIX) injection 0.5 mL (has no administration in time range)    ED Course/ Medical Decision Making/ A&P  Patient presenting with avulsed skin on the distal aspect of the left middle finger when he sliced it on a piece of metal while installing a new microwave.  No sutures are possible.  Wound will be treated with wound care and healing by secondary intention.  Tetanus updated.  Final Clinical Impression(s) / ED Diagnoses Final diagnoses:  None    Rx / DC Orders ED Discharge Orders     None         Geoffery Lyons, MD 08/07/23 2352

## 2023-08-07 NOTE — Discharge Instructions (Signed)
Local wound care with bacitracin and dressing changes twice daily.    Return to the ER if you experience any new and or concerning issues including redness around the wound, pus draining from the wound, red streaks extending up the arm.

## 2023-08-07 NOTE — ED Triage Notes (Signed)
Pt POV after cutting L middle finger while installing microwave. Bleeding controlled with guaze PTA. Unknown last tetanus

## 2023-08-08 MED ORDER — GELATIN ABSORBABLE 12-7 MM EX MISC
CUTANEOUS | Status: AC
  Filled 2023-08-08: qty 1

## 2023-08-08 NOTE — ED Notes (Signed)
Pt's wound to left middle finger dressed with surgifoam, bacitracin, and gauze. Band-aid with bacitracin applied to abrasion on left 4th finger.

## 2023-11-01 ENCOUNTER — Ambulatory Visit: Payer: No Typology Code available for payment source | Attending: Internal Medicine | Admitting: Internal Medicine

## 2023-11-01 ENCOUNTER — Encounter: Payer: Self-pay | Admitting: Internal Medicine

## 2023-11-01 VITALS — BP 142/89 | HR 62 | Temp 98.2°F | Ht 67.0 in | Wt 175.0 lb

## 2023-11-01 DIAGNOSIS — Z7689 Persons encountering health services in other specified circumstances: Secondary | ICD-10-CM

## 2023-11-01 DIAGNOSIS — Z2821 Immunization not carried out because of patient refusal: Secondary | ICD-10-CM

## 2023-11-01 DIAGNOSIS — R030 Elevated blood-pressure reading, without diagnosis of hypertension: Secondary | ICD-10-CM | POA: Diagnosis not present

## 2023-11-01 DIAGNOSIS — Z5320 Procedure and treatment not carried out because of patient's decision for unspecified reasons: Secondary | ICD-10-CM

## 2023-11-01 DIAGNOSIS — E663 Overweight: Secondary | ICD-10-CM

## 2023-11-01 DIAGNOSIS — Z1211 Encounter for screening for malignant neoplasm of colon: Secondary | ICD-10-CM

## 2023-11-01 DIAGNOSIS — S29012A Strain of muscle and tendon of back wall of thorax, initial encounter: Secondary | ICD-10-CM

## 2023-11-01 DIAGNOSIS — Z6827 Body mass index (BMI) 27.0-27.9, adult: Secondary | ICD-10-CM

## 2023-11-01 DIAGNOSIS — Z0000 Encounter for general adult medical examination without abnormal findings: Secondary | ICD-10-CM

## 2023-11-01 DIAGNOSIS — Z0001 Encounter for general adult medical examination with abnormal findings: Secondary | ICD-10-CM

## 2023-11-01 NOTE — Patient Instructions (Addendum)
Your blood pressure ss elevated.  Normal blood pressure is less than 120/80.  Check your blood pressure at least twice a week and record the readings.  Bring these readings with you when you come back to see [place] in 1 month.  Goals for exercise is at least 150 minutes/week of moderate intensity exercise.  When you have strain in the muscle on the right side of your back from weightlifting, stop the weight lifting for 1 to 2 weeks and then resume using lighter weights.  Try to decrease salt in your diet as salt adversely affects the blood pressure.  You will receive the Cologuard kit in the mail for colon cancer screening.  DASH Eating Plan DASH stands for Dietary Approaches to Stop Hypertension. The DASH eating plan is a healthy eating plan that has been shown to: Lower high blood pressure (hypertension). Reduce your risk for type 2 diabetes, heart disease, and stroke. Help with weight loss. What are tips for following this plan? Reading food labels Check food labels for the amount of salt (sodium) per serving. Choose foods with less than 5 percent of the Daily Value (DV) of sodium. In general, foods with less than 300 milligrams (mg) of sodium per serving fit into this eating plan. To find whole grains, look for the word "whole" as the first word in the ingredient list. Shopping Buy products labeled as "low-sodium" or "no salt added." Buy fresh foods. Avoid canned foods and pre-made or frozen meals. Cooking Try not to add salt when you cook. Use salt-free seasonings or herbs instead of table salt or sea salt. Check with your health care provider or pharmacist before using salt substitutes. Do not fry foods. Cook foods in healthy ways, such as baking, boiling, grilling, roasting, or broiling. Cook using oils that are good for your heart. These include olive, canola, avocado, soybean, and sunflower oil. Meal planning  Eat a balanced diet. This should include: 4 or more servings of fruits and  4 or more servings of vegetables each day. Try to fill half of your plate with fruits and vegetables. 6-8 servings of whole grains each day. 6 or less servings of lean meat, poultry, or fish each day. 1 oz is 1 serving. A 3 oz (85 g) serving of meat is about the same size as the palm of your hand. One egg is 1 oz (28 g). 2-3 servings of low-fat dairy each day. One serving is 1 cup (237 mL). 1 serving of nuts, seeds, or beans 5 times each week. 2-3 servings of heart-healthy fats. Healthy fats called omega-3 fatty acids are found in foods such as walnuts, flaxseeds, fortified milks, and eggs. These fats are also found in cold-water fish, such as sardines, salmon, and mackerel. Limit how much you eat of: Canned or prepackaged foods. Food that is high in trans fat, such as fried foods. Food that is high in saturated fat, such as fatty meat. Desserts and other sweets, sugary drinks, and other foods with added sugar. Full-fat dairy products. Do not salt foods before eating. Do not eat more than 4 egg yolks a week. Try to eat at least 2 vegetarian meals a week. Eat more home-cooked food and less restaurant, buffet, and fast food. Lifestyle When eating at a restaurant, ask if your food can be made with less salt or no salt. If you drink alcohol: Limit how much you have to: 0-1 drink a day if you are male. 0-2 drinks a day if you are male.  Know how much alcohol is in your drink. In the [place] one drink is one 12 oz bottle of beer (355 mL), one 5 oz glass of wine (148 mL), or one 1 oz glass of hard liquor (44 mL). General information Avoid eating more than 2,300 mg of salt a day. If you have hypertension, you may need to reduce your sodium intake to 1,500 mg a day. Work with your provider to stay at a healthy body weight or lose weight. Ask what the best weight range is for you. On most days of the week, get at least 30 minutes of exercise that causes your heart to beat faster. This may include  walking, swimming, or biking. Work with your provider or dietitian to adjust your eating plan to meet your specific calorie needs. What foods should I eat? Fruits All fresh, dried, or frozen fruit. Canned fruits that are in their natural juice and do not have sugar added to them. Vegetables Fresh or frozen vegetables that are raw, steamed, roasted, or grilled. Low-sodium or reduced-sodium tomato and vegetable juice. Low-sodium or reduced-sodium tomato sauce and tomato paste. Low-sodium or reduced-sodium canned vegetables. Grains Whole-grain or whole-wheat bread. Whole-grain or whole-wheat pasta. Brown rice. Orpah Cobb. Bulgur. Whole-grain and low-sodium cereals. Pita bread. Low-fat, low-sodium crackers. Whole-wheat flour tortillas. Meats and other proteins Skinless chicken or [place]. Ground chicken or [place]. Pork with fat trimmed off. Fish and seafood. Egg whites. Dried beans, peas, or lentils. Unsalted nuts, nut butters, and seeds. Unsalted canned beans. Lean cuts of beef with fat trimmed off. Low-sodium, lean precooked or cured meat, such as sausages or meat loaves. Dairy Low-fat (1%) or fat-free (skim) milk. Reduced-fat, low-fat, or fat-free cheeses. Nonfat, low-sodium ricotta or cottage cheese. Low-fat or nonfat yogurt. Low-fat, low-sodium cheese. Fats and oils Soft margarine without trans fats. Vegetable oil. Reduced-fat, low-fat, or light mayonnaise and salad dressings (reduced-sodium). Canola, safflower, olive, avocado, soybean, and sunflower oils. Avocado. Seasonings and condiments Herbs. Spices. Seasoning mixes without salt. Other foods Unsalted popcorn and pretzels. Fat-free sweets. The items listed above may not be all the foods and drinks you can have. Talk to a dietitian to learn more. What foods should I avoid? Fruits Canned fruit in a light or heavy syrup. Fried fruit. Fruit in cream or butter sauce. Vegetables Creamed or fried vegetables. Vegetables in a cheese sauce.  Regular canned vegetables that are not marked as low-sodium or reduced-sodium. Regular canned tomato sauce and paste that are not marked as low-sodium or reduced-sodium. Regular tomato and vegetable juices that are not marked as low-sodium or reduced-sodium. Rosita Fire. Olives. Grains Baked goods made with fat, such as croissants, muffins, or some breads. Dry pasta or rice meal packs. Meats and other proteins Fatty cuts of meat. Ribs. Fried meat. Tomasa Blase. Bologna, salami, and other precooked or cured meats, such as sausages or meat loaves, that are not lean and low in sodium. Fat from the back of a pig (fatback). Bratwurst. Salted nuts and seeds. Canned beans with added salt. Canned or smoked fish. Whole eggs or egg yolks. Chicken or [place] with skin. Dairy Whole or 2% milk, cream, and half-and-half. Whole or full-fat cream cheese. Whole-fat or sweetened yogurt. Full-fat cheese. Nondairy creamers. Whipped toppings. Processed cheese and cheese spreads. Fats and oils Butter. Stick margarine. Lard. Shortening. Ghee. Bacon fat. Tropical oils, such as coconut, palm kernel, or palm oil. Seasonings and condiments Onion salt, garlic salt, seasoned salt, table salt, and sea salt. Worcestershire sauce. Tartar sauce. Barbecue sauce. Teriyaki sauce.  Soy sauce, including reduced-sodium soy sauce. Steak sauce. Canned and packaged gravies. Fish sauce. Oyster sauce. Cocktail sauce. Store-bought horseradish. Ketchup. Mustard. Meat flavorings and tenderizers. Bouillon cubes. Hot sauces. Pre-made or packaged marinades. Pre-made or packaged taco seasonings. Relishes. Regular salad dressings. Other foods Salted popcorn and pretzels. The items listed above may not be all the foods and drinks you should avoid. Talk to a dietitian to learn more. Where to find more information National Heart, Lung, and Blood Institute (NHLBI): BuffaloDryCleaner.gl American Heart Association (AHA): heart.org Academy of Nutrition and Dietetics:  eatright.org National Kidney Foundation (NKF): kidney.org This information is not intended to replace advice given to you by your health care provider. Make sure you discuss any questions you have with your health care provider. Document Revised: 11/19/2022 Document Reviewed: 11/19/2022 Elsevier Patient Education  2024 [employer].

## 2023-11-01 NOTE — Progress Notes (Signed)
Patient ID: Tony Dawson, male    DOB: [birth year]  MRN: [id number]  CC: Establish Care (Est care / New pt. Tony Dawson routine blood work Tony Dawson to colonoscopy referral)   Subjective: Tony Dawson is a 52 y.o. male who presents for new pt visit/physical. 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  Last seen 2 yrs ago.  Thinks his PCP there has retired.  No chronic med issues except kidney stones.   Would like to have physical.   BP elev today.  No prior hx.  Endorses using more salt in foods than he should Pt is vegeterean but eating out a lot recently No CP/SOB  Gets pain RT wrist below RT shoulder blade when he does a lot of wgh lift during exercise. No swelling of wrist.  HM: due for colon CA screening; no fhx hx.  Declines flu shot today.  Will check to see if he has had shingles vaccine.  Your blood pressure had Tdapt 08/07/23 through ER.  He declines screening for HIV and hepatitis C.   Current Outpatient Medications on File Prior to Visit  Medication Sig Dispense Refill   cetirizine (ZYRTEC) 10 MG tablet Take 10 mg by mouth daily. (Patient not taking: Reported on 02/13/2021)     Cholecalciferol (VITAMIN D3) 125 MCG (5000 UT) CAPS See admin instructions. (Patient not taking: Reported on 11/01/2023)     Cyanocobalamin (VITAMIN B12) 1000 MCG TBCR 1 tablet (Patient not taking: Reported on 11/01/2023)     HYDROcodone-acetaminophen (NORCO) 10-325 MG per tablet Take 1 tablet by mouth every 6 (six) hours as needed for pain. (Patient not taking: Reported on 11/01/2023) 30 tablet 0   tamsulosin (FLOMAX) 0.4 MG CAPS Take 0.4 mg by mouth daily after breakfast. (Patient not taking: Reported on 02/13/2021)     tamsulosin (FLOMAX) 0.4 MG CAPS Take 1 capsule (0.4 mg total) by mouth daily after supper. (Patient not taking: Reported on 11/01/2023) 30 capsule 11   No current facility-administered medications on file prior to visit.    No Known  Allergies  Social History   Socioeconomic History   Marital status: Married    Spouse name: Not on file   Number of children: 1   Years of education: Not on file   Highest education level: Master's degree (e.g., MA, MS, MEng, MEd, MSW, MBA)  Occupational History    Comment: Tony Dawson  Tobacco Use   Smoking status: Never    Passive exposure: Never   Smokeless tobacco: Never  Vaping Use   Vaping status: Never Used  Substance and Sexual Activity   Alcohol use: Yes    Comment: Only during social gatherings   Drug use: No   Sexual activity: Yes  Other Topics Concern   Not on file  Social History Narrative   Not on file   Social Drivers of Health   Dawson Resource Strain: Low Risk  (11/01/2023)   Overall Dawson Resource Strain (CARDIA)    Difficulty of Paying Living Expenses: Not hard at all  Food Insecurity: No Food Insecurity (11/01/2023)   Hunger Vital Sign    Worried About Running Out of Food in the Last Year: Never true    Ran Out of Food in the Last Year: Never true  Transportation Needs: No Transportation Needs (11/01/2023)   Tony Dawson (Medical): No    Lack of Transportation (Non-Medical): No  Physical Activity: Sufficiently Active (11/01/2023)  Exercise Vital Sign    Days of Exercise per Week: 3 days    Minutes of Exercise per Session: 60 min  Stress: Stress Concern Present (11/01/2023)   Tony Dawson of Occupational Health - Occupational Stress Questionnaire    Feeling of Stress : To some extent  Social Connections: Socially Integrated (11/01/2023)   Social Connection and Isolation Panel [NHANES]    Frequency of Communication with Friends and Family: More than three times a week    Frequency of Social Gatherings with Friends and Family: Once a week    Attends Religious Services: 1 to 4 times per year    Active Member of Tony Dawson or Organizations: No    Attends Engineer, structural: More  than 4 times per year    Marital Status: Married  Catering manager Violence: Not At Risk (11/01/2023)   Humiliation, Afraid, Rape, and Kick questionnaire    Fear of Current or Ex-Partner: No    Emotionally Abused: No    Physically Abused: No    Sexually Abused: No    Family History  Problem Relation Age of Onset   Diabetes Mother     Past Surgical History:  Procedure Laterality Date   NO PAST SURGERIES      ROS: Review of Systems  Constitutional:        Exercises 3 days a week for 30 minutes by running on the treadmill 3 miles.  He also does some weight training 3 days a week.  HENT:  Negative for hearing loss, sinus pressure, sore throat and trouble swallowing.        Has seasonal allergies for which he takes an over-the-counter antihistamine as needed.  Eyes:        Wears prescription glasses for distance.  Last eye exam was 1 year ago.  Respiratory:  Negative for shortness of breath.        Occasional cough when exposed to dust.  Cardiovascular:  Negative for chest pain and palpitations.  Gastrointestinal:  Negative for abdominal pain and blood in stool.       Has regular bowel movements.  Genitourinary:  Negative for hematuria.  Skin:  Negative for rash.   Negative except as stated above  PHYSICAL EXAM: BP (!) 142/89   Pulse 62   Temp 98.2 F (36.8 C) (Oral)   Ht 5\' 7"  (1.702 m)   Wt 175 lb (79.4 kg)   SpO2 98%   BMI 27.41 kg/m   Physical Exam Repeat blood pressure was 142/89  General appearance - alert, well appearing, middle-age male and in no distress Mental status - normal mood, behavior, speech, dress, motor activity, and thought processes Eyes - pupils equal and reactive, extraocular eye movements intact Ears - bilateral TM's and external ear canals normal Nose - normal and patent, no erythema, discharge or polyps Mouth - mucous membranes moist, pharynx normal without lesions Neck - supple, no significant adenopathy Lymphatics - no palpable  lymphadenopathy, no hepatosplenomegaly Chest - clear to auscultation, no wheezes, rales or rhonchi, symmetric air entry Heart - normal rate, regular rhythm, normal S1, S2, no murmurs, rubs, clicks or gallops Abdomen - soft, nontender, nondistended, no masses or organomegaly Neurological - cranial nerves II through XII intact, motor and sensory grossly normal bilaterally Musculoskeletal - no joint tenderness, deformity or swelling.  Good range of motion of the right shoulder.  Mild discomfort below the right shoulder blade with movement of the shoulder joint. Extremities - peripheral pulses normal, no pedal edema, no clubbing  or cyanosis     Latest Ref Rng & Units 01/24/2013    6:20 AM  CMP  Glucose 70 - 99 mg/dL 322   BUN 6 - 23 mg/dL 15   Creatinine 0.25 - 1.35 mg/dL 4.27   Sodium 062 - 376 mEq/L 139   Potassium 3.5 - 5.1 mEq/L 4.4   Chloride 96 - 112 mEq/L 102   CO2 19 - 32 mEq/L 25   Calcium 8.4 - 10.5 mg/dL 9.2   Total Protein 6.0 - 8.3 g/dL 7.8   Total Bilirubin 0.3 - 1.2 mg/dL 0.3   Alkaline Phos 39 - 117 U/L 79   AST 0 - 37 U/L 35   ALT 0 - 53 U/L 25    Lipid Panel  No results found for: "CHOL", "TRIG", "HDL", "CHOLHDL", "VLDL", "LDLCALC", "LDLDIRECT"  CBC    Component Value Date/Time   WBC 10.1 01/24/2013 0620   RBC 5.25 01/24/2013 0620   HGB 14.9 01/24/2013 0620   HCT 42.9 01/24/2013 0620   PLT 145 (L) 01/24/2013 0620   MCV 81.7 01/24/2013 0620   MCH 28.4 01/24/2013 0620   MCHC 34.7 01/24/2013 0620   RDW 12.7 01/24/2013 0620   LYMPHSABS 1.5 01/24/2013 0620   MONOABS 0.3 01/24/2013 0620   EOSABS 0.1 01/24/2013 0620   BASOSABS 0.0 01/24/2013 0620    ASSESSMENT AND PLAN: 1. Establishing care with new doctor, encounter for (Primary)   2. Annual physical exam Discussed on encourage healthy eating habits.  Patient is vegetarian but has been eating out more recently. Continue regular exercise with goal of getting in at least 150 minutes/week total of moderate  intensity exercise -Discussed the importance of twice a year dental exam for routine cleaning Discussed importance of eye exam at least once every 2 years. - CBC - Comprehensive metabolic panel - Lipid panel  3. Elevated blood pressure reading in office without diagnosis of hypertension Advised that normal blood pressure is 120/80 or lower. DASH discussed and encouraged.  Printed info given He does have access to a blood pressure monitoring device at home.  Advised to check blood pressure at least twice a week and record the readings.  Bring readings with him on follow-up in 1 month.  4. Overweight (BMI 25.0-29.9) See #2 above  5. Strain of thoracic paraspinal muscles excluding T1 and T2 levels, initial encounter Recommend that he stop weight lifting for 1 to 2 weeks whenever he feels a strain in the muscle of his wrist or right posterior thorax.  When he resumes weightlifting, he should decrease the amount of weight by 5 to 10 pounds from previous.  6. Screening for colon cancer Discussed colon cancer screening methods.  He is agreeable to doing Cologuard instead of colonoscopy. - Cologuard  7. Influenza vaccination declined   8. Screening for hepatitis C declined   9. HIV screening declined   Patient was given the opportunity to ask questions.  Patient verbalized understanding of the plan and was able to repeat key elements of the plan.   This documentation was completed using Paediatric nurse.  Any transcriptional errors are unintentional.  Orders Placed This Encounter  Procedures   Cologuard   CBC   Comprehensive metabolic panel   Lipid panel     Requested Prescriptions    No prescriptions requested or ordered in this encounter    Return in about 4 weeks (around 11/29/2023) for for BP check.  Jonah Blue, MD, FACP

## 2023-11-02 LAB — COMPREHENSIVE METABOLIC PANEL
ALT: 26 [IU]/L (ref 0–44)
AST: 26 [IU]/L (ref 0–40)
Albumin: 4.6 g/dL (ref 4.1–5.1)
Alkaline Phosphatase: 97 [IU]/L (ref 44–121)
BUN/Creatinine Ratio: 11 (ref 9–20)
BUN: 11 mg/dL (ref 6–24)
Bilirubin Total: 0.4 mg/dL (ref 0.0–1.2)
CO2: 23 mmol/L (ref 20–29)
Calcium: 9.7 mg/dL (ref 8.7–10.2)
Chloride: 103 mmol/L (ref 96–106)
Creatinine, Ser: 1.01 mg/dL (ref 0.76–1.27)
Globulin, Total: 2.7 g/dL (ref 1.5–4.5)
Glucose: 91 mg/dL (ref 70–99)
Potassium: 5.1 mmol/L (ref 3.5–5.2)
Sodium: 141 mmol/L (ref 134–144)
Total Protein: 7.3 g/dL (ref 6.0–8.5)
eGFR: 91 mL/min/{1.73_m2} (ref >59)

## 2023-11-02 LAB — CBC
Hematocrit: 45.7 % (ref 37.5–51.0)
Hemoglobin: 15.2 g/dL (ref 13.0–17.7)
MCH: 28 pg (ref 26.6–33.0)
MCHC: 33.3 g/dL (ref 31.5–35.7)
MCV: 84 fL (ref 79–97)
Platelets: 175 10*3/uL (ref 150–450)
RBC: 5.42 x10E6/uL (ref 4.14–5.80)
RDW: 12.5 % (ref 11.6–15.4)
WBC: 5.3 10*3/uL (ref 3.4–10.8)

## 2023-11-02 LAB — LIPID PANEL
Chol/HDL Ratio: 4 {ratio} (ref 0.0–5.0)
Cholesterol, Total: 190 mg/dL (ref 100–199)
HDL: 47 mg/dL (ref >39)
LDL Chol Calc (NIH): 125 mg/dL — ABNORMAL HIGH (ref 0–99)
Triglycerides: 100 mg/dL (ref 0–149)
VLDL Cholesterol Cal: 18 mg/dL (ref 5–40)

## 2023-11-16 ENCOUNTER — Telehealth: Payer: Self-pay | Admitting: Internal Medicine

## 2023-11-16 NOTE — Telephone Encounter (Signed)
Given results to lab test.  Provided education on diet, exercise, and  taking blood pressures at home. Reminded of next appointment in February and to bring readings to appt.   Patient verbalized understanding.

## 2023-11-16 NOTE — Telephone Encounter (Signed)
Pt called in for lab results. Please cb

## 2023-11-22 LAB — COLOGUARD: COLOGUARD: NEGATIVE

## 2023-12-31 ENCOUNTER — Ambulatory Visit: Payer: No Typology Code available for payment source | Admitting: Internal Medicine
